# Patient Record
Sex: Female | Born: 1995 | Race: White | Hispanic: No | Marital: Single | Smoking: Never smoker
Health system: Southern US, Community
[De-identification: ages and names within clinical notes are randomized; demographics above are authoritative.]

## PROBLEM LIST (undated history)

## (undated) DIAGNOSIS — K5900 Constipation, unspecified: Secondary | ICD-10-CM

## (undated) DIAGNOSIS — R079 Chest pain, unspecified: Secondary | ICD-10-CM

## (undated) DIAGNOSIS — F840 Autistic disorder: Secondary | ICD-10-CM

## (undated) DIAGNOSIS — F845 Asperger's syndrome: Secondary | ICD-10-CM

## (undated) DIAGNOSIS — R55 Syncope and collapse: Secondary | ICD-10-CM

## (undated) DIAGNOSIS — F419 Anxiety disorder, unspecified: Secondary | ICD-10-CM

## (undated) DIAGNOSIS — T7840XA Allergy, unspecified, initial encounter: Secondary | ICD-10-CM

## (undated) HISTORY — DX: Allergy, unspecified, initial encounter: T78.40XA

## (undated) HISTORY — DX: Syncope and collapse: R55

## (undated) HISTORY — DX: Autistic disorder: F84.0

## (undated) HISTORY — PX: TYMPANOSTOMY TUBE PLACEMENT: SHX32

## (undated) HISTORY — DX: Asperger's syndrome: F84.5

## (undated) HISTORY — DX: Anxiety disorder, unspecified: F41.9

## (undated) HISTORY — DX: Chest pain, unspecified: R07.9

## (undated) HISTORY — DX: Constipation, unspecified: K59.00

---
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
## 2019-04-17 ENCOUNTER — Other Ambulatory Visit: Payer: Self-pay

## 2019-04-17 ENCOUNTER — Ambulatory Visit (INDEPENDENT_AMBULATORY_CARE_PROVIDER_SITE_OTHER): Payer: Medicaid Other | Admitting: Podiatry

## 2019-04-17 VITALS — Temp 97.9°F

## 2019-04-17 DIAGNOSIS — L603 Nail dystrophy: Secondary | ICD-10-CM

## 2019-04-17 NOTE — Progress Notes (Signed)
  Subjective:  Patient ID: Marissa Webster, female    DOB: 08/10/1995,  MRN: [id number] HPI Chief Complaint  Patient presents with  . Nail Problem    Patient presents with mother today for nail dystrophy bilat great toenails x 2-3 years.  She reports they are tender to touch and right nail is loose from nail bed    24 y.o. female presents with the above complaint.   ROS: Denies fever chills nausea vomiting muscle aches pains calf pain back pain chest pain shortness of breath.  Past Medical History:  Diagnosis Date  . Asperger syndrome   . Autism   . Chest pain, unspecified   . Syncope and collapse   . Unspecified constipation    No past surgical history on file.  Current Outpatient Medications:  .  cetirizine (ZYRTEC) 5 MG chewable tablet, Chew 5 mg by mouth daily., Disp: , Rfl:  .  famotidine (PEPCID) 20 MG tablet, Take by mouth., Disp: , Rfl:  .  Ascorbic Acid (VITAMIN C ER PO), Take 60 mLs by mouth daily., Disp: , Rfl:  .  B COMPLEX-C-BIOTIN-E-FA PO, Take 60 mLs by mouth daily., Disp: , Rfl:  .  DIGESTIVE ENZYMES PO, Take 60 mLs by mouth once a week., Disp: , Rfl:  .  OVER THE COUNTER MEDICATION, daily. 3 tabs daily... Therma Tabs, Disp: , Rfl:  .  OVER THE COUNTER MEDICATION, Antioxidant liquid, 1 cap full., Disp: , Rfl:   No Known Allergies Review of Systems Objective:   Vitals:   04/17/19 0951  Temp: 97.9 F (36.6 C)    General: Well developed, nourished, in no acute distress, alert and oriented x3   Dermatological: Skin is warm, dry and supple bilateral. Nails x 10 are well maintained; remaining integument appears unremarkable at this time. There are no open sores, no preulcerative lesions, no rash or signs of infection present.  Hallux nails bilaterally are thickened discolored and brittle.  Vascular: Dorsalis Pedis artery and Posterior Tibial artery pedal pulses are 2/4 bilateral with immedate capillary fill time. Pedal hair growth present. No varicosities and no  lower extremity edema present bilateral.   Neruologic: Grossly intact via light touch bilateral. Vibratory intact via tuning fork bilateral. Protective threshold with Semmes Wienstein monofilament intact to all pedal sites bilateral. Patellar and Achilles deep tendon reflexes 2+ bilateral. No Babinski or clonus noted bilateral.   Musculoskeletal: No gross boney pedal deformities bilateral. No pain, crepitus, or limitation noted with foot and ankle range of motion bilateral. Muscular strength 5/5 in all groups tested bilateral.  Gait: Unassisted, Nonantalgic.    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  We will discuss our options at that time as to whether treat any infection.  We also will discuss total matrixectomy's bilateral.     Kacin Dancy T. [place]ee[place], [place]

## 2019-04-17 NOTE — Patient Instructions (Signed)

## 2019-05-15 ENCOUNTER — Other Ambulatory Visit: Payer: Self-pay

## 2019-05-15 ENCOUNTER — Ambulatory Visit (INDEPENDENT_AMBULATORY_CARE_PROVIDER_SITE_OTHER): Payer: Medicare Other | Admitting: Podiatry

## 2019-05-15 DIAGNOSIS — L603 Nail dystrophy: Secondary | ICD-10-CM

## 2019-05-15 MED ORDER — FLUCONAZOLE 150 MG PO TABS: 300.0000 mg | ORAL_TABLET | ORAL | 0 refills | Status: DC

## 2019-05-15 NOTE — Progress Notes (Signed)
She presents today for follow-up of her nail samples. Samples were regarding the hallux nail bilaterally and the second digit.  Objective: No change in the nail plates. Pathology does result yeast infection.  Assessment: Yeast infection of the nail plate and nailbed.  Plan: She will take fluconazole 2 tablets once weekly for the next 11 months and in 3 months we will do liver profiles.

## 2019-08-21 ENCOUNTER — Other Ambulatory Visit: Payer: Self-pay

## 2019-08-21 ENCOUNTER — Ambulatory Visit (INDEPENDENT_AMBULATORY_CARE_PROVIDER_SITE_OTHER): Payer: PRIVATE HEALTH INSURANCE | Admitting: Podiatry

## 2019-08-21 DIAGNOSIS — L603 Nail dystrophy: Secondary | ICD-10-CM

## 2019-08-21 DIAGNOSIS — Z79899 Other long term (current) drug therapy: Secondary | ICD-10-CM

## 2019-08-21 MED ORDER — FLUCONAZOLE 150 MG PO TABS: 300.0000 mg | ORAL_TABLET | ORAL | 0 refills | Status: DC

## 2019-08-21 NOTE — Progress Notes (Signed)
She presents today with her mother for follow-up of her fluconazole hallux nail plate right.  She states that they can already tell a difference that it seems to be growing out better.  She would like for me to cut her nail hallux right.  Objective: Vital signs are stable alert and oriented x3 nail plate does appear to be growing out better from the proximal nail fold hallux right in particular.  Assessment: Nail dystrophy yeast.  Plan: Continue the use of the fluconazole and I trimmed the nail for her today.

## 2019-11-27 ENCOUNTER — Ambulatory Visit (INDEPENDENT_AMBULATORY_CARE_PROVIDER_SITE_OTHER): Payer: PRIVATE HEALTH INSURANCE | Admitting: Podiatry

## 2019-11-27 ENCOUNTER — Other Ambulatory Visit: Payer: Self-pay

## 2019-11-27 DIAGNOSIS — L603 Nail dystrophy: Secondary | ICD-10-CM

## 2019-11-27 MED ORDER — FLUCONAZOLE 150 MG PO TABS: 300.0000 mg | ORAL_TABLET | ORAL | 0 refills | Status: DC

## 2019-11-27 NOTE — Progress Notes (Signed)
She presents with her mother today for evaluation of her fluconazole and her foot fungus.  Her mother herself states that it seems to be growing out very nicely denies any problems taking the medication.  Objective: Vital signs stable alert and oriented x3 toenails are long thick yellow dystrophic-like mycotic but appear to be growing out.  Assessment: Slowly growing onychomycosis.  Plan: At this point I did refill her fluconazole and I will follow-up with her in about 3 months.  I also debrided her nails for her.

## 2020-03-04 ENCOUNTER — Encounter: Payer: Self-pay | Admitting: Podiatry

## 2020-03-04 ENCOUNTER — Other Ambulatory Visit: Payer: Self-pay

## 2020-03-04 ENCOUNTER — Ambulatory Visit (INDEPENDENT_AMBULATORY_CARE_PROVIDER_SITE_OTHER): Payer: PRIVATE HEALTH INSURANCE | Admitting: Podiatry

## 2020-03-04 DIAGNOSIS — L603 Nail dystrophy: Secondary | ICD-10-CM

## 2020-03-04 MED ORDER — FLUCONAZOLE 150 MG PO TABS: 300.0000 mg | ORAL_TABLET | ORAL | 0 refills | Status: DC

## 2020-03-04 NOTE — Progress Notes (Signed)
She presents today for follow-up of her fluconazole states that the toenails are looking better with exception of the hallux right.  She continues to take the medication without any problems.  Objective: Vital signs are stable she is alert and oriented x3 there is no erythema edema cellulitis drainage or odor.  Nails appear to be healing with the exception of the hallux right.  Assessment: Onychomycosis hallux right with nail dystrophy.  Plan: We will continue the use of her fluconazole and I will follow-up with her in 3 months.

## 2020-04-02 ENCOUNTER — Ambulatory Visit (INDEPENDENT_AMBULATORY_CARE_PROVIDER_SITE_OTHER): Payer: Medicare Other | Admitting: Podiatry

## 2020-04-02 ENCOUNTER — Other Ambulatory Visit: Payer: Self-pay

## 2020-04-02 ENCOUNTER — Encounter: Payer: Self-pay | Admitting: Podiatry

## 2020-04-02 DIAGNOSIS — L03031 Cellulitis of right toe: Secondary | ICD-10-CM | POA: Diagnosis not present

## 2020-04-02 DIAGNOSIS — L600 Ingrowing nail: Secondary | ICD-10-CM

## 2020-04-02 DIAGNOSIS — L603 Nail dystrophy: Secondary | ICD-10-CM | POA: Diagnosis not present

## 2020-04-02 NOTE — Progress Notes (Signed)
  Subjective:  Patient ID: Marissa Webster, female    DOB: 1995-04-28,  MRN: [id number]  Chief Complaint  Patient presents with  . Toe Pain    Right hallux has a pus pocket on the side of the toe     25 y.o. female presents with the above complaint. History confirmed with patient.  She returns with her mother for an urgent visit.  Dr. Al Corpus has been treating her for nail dystrophy and nail fungus for some time.  This is improved quite a bit.  Over the last couple days the lateral right corner of the hallux nail began to really digging the corners and cause pain and swelling.  Having quite a bit of pus last night.  Objective:  Physical Exam: warm, good capillary refill, no trophic changes or ulcerative lesions, normal DP and PT pulses and normal sensory exam.   Right Foot: Dystrophic hallux nail with onycholysis and distal lateral corner deeply ingrown into the skin causing paronychia  Assessment:  No diagnosis found.   Plan:  Patient was evaluated and treated and all questions answered.  Unfortunately hallux nail has become quite loose and is now digging in the corner causing a deep paronychia ingrown.  Discussed the patient and her mother it would be best for total nail avulsion and allow the new nail to continue to grow out while she is still on fluconazole.  There should not agreed to the procedure.    Ingrown Nail, right -Patient elects to proceed with minor surgery to remove ingrown toenail today. Consent reviewed and signed by patient and her mother. -Ingrown nail excised. See procedure note. -Educated on post-procedure care including soaking. Written instructions provided and reviewed. -Patient to follow up with Dr. Al Corpus  Procedure: Excision of Ingrown Toenail Location: Right 1st toe  nail . Anesthesia: Lidocaine 1% plain; 1.5 mL and Marcaine 0.5% plain; 1.5 mL, digital block. Skin Prep: Betadine. Dressing: Silvadene; telfa; dry, sterile, compression dressing. Technique:  Following skin prep, the toe was exsanguinated and a tourniquet was secured at the base of the toe. The affected nail was freed, and excised. . The tourniquet was then removed and sterile dressing applied. Disposition: Patient tolerated procedure well.     No follow-ups on file.

## 2020-04-02 NOTE — Patient Instructions (Signed)

## 2020-04-16 ENCOUNTER — Encounter: Payer: Self-pay | Admitting: Podiatry

## 2020-04-16 ENCOUNTER — Ambulatory Visit (INDEPENDENT_AMBULATORY_CARE_PROVIDER_SITE_OTHER): Payer: Medicare Other | Admitting: Podiatry

## 2020-04-16 ENCOUNTER — Other Ambulatory Visit: Payer: Self-pay

## 2020-04-16 DIAGNOSIS — Z9889 Other specified postprocedural states: Secondary | ICD-10-CM

## 2020-04-16 DIAGNOSIS — L03031 Cellulitis of right toe: Secondary | ICD-10-CM

## 2020-04-18 NOTE — Progress Notes (Signed)
Marissa Webster presents with her mother today for follow-up of her nail avulsion hallux right t.  She denies fever chills nausea vomit states that she is doing quite well.  She states it seems to be healing up.  Objective: Vital signs are stable alert oriented x3.  There is no erythema edema cellulitis drainage odor scabs present over the hallux nail bed.  Assessment: Well-healing surgical toe performed by Dr. Lilian Kapur.  Plan: Covered in the day leave open at night start soaking if it becomes tender.  Epson salts and warm water will suffice.  She will follow-up with me on an in the next few weeks for follow-up of her oral medication.

## 2020-06-03 ENCOUNTER — Ambulatory Visit: Payer: Medicare Other | Admitting: Podiatry

## 2020-06-17 ENCOUNTER — Encounter: Payer: Self-pay | Admitting: Podiatry

## 2020-06-17 ENCOUNTER — Ambulatory Visit (INDEPENDENT_AMBULATORY_CARE_PROVIDER_SITE_OTHER): Payer: PRIVATE HEALTH INSURANCE | Admitting: Podiatry

## 2020-06-17 ENCOUNTER — Other Ambulatory Visit: Payer: Self-pay

## 2020-06-17 DIAGNOSIS — L603 Nail dystrophy: Secondary | ICD-10-CM

## 2020-06-17 NOTE — Progress Notes (Signed)
She presents today with her mother stating that her nails look good she is happy with the way they are growing out and have improved over the past year.  She denies any problems taking the medication.  Objective: Vital signs are stable she is alert and oriented x3.  There is no erythema edema cellulitis drainage or odor.  Toenails have improved over the past year with the use of the fluconazole.  Assessment: Long-term therapy for onychomycosis.  Plan: At this point we are going to discontinue the use of the fluconazole until we see the need to start it again.  Should she have questions or concerns or thinks that she has any recurrent she will notify us immediately.

## 2020-09-02 ENCOUNTER — Ambulatory Visit: Payer: PRIVATE HEALTH INSURANCE | Admitting: Podiatry

## 2022-05-19 ENCOUNTER — Ambulatory Visit (INDEPENDENT_AMBULATORY_CARE_PROVIDER_SITE_OTHER): Payer: Medicare Other | Admitting: Podiatry

## 2022-05-19 DIAGNOSIS — L603 Nail dystrophy: Secondary | ICD-10-CM | POA: Diagnosis not present

## 2022-05-19 DIAGNOSIS — L600 Ingrowing nail: Secondary | ICD-10-CM | POA: Diagnosis not present

## 2022-05-19 NOTE — Progress Notes (Signed)
She presents today chief concern of a painful hallux nail bilaterally.  These are dystrophic nails are starting to grow into the skin.  Objective: Vital stable oriented x 3 there is no erythema Dem salines drainage odor nails are thick yellow dystrophic possibly mycotic incurvated debrided them today no pain.  Assessment: Well-healing nail dystrophy onychomycosis.  Plan: Follow-up with her as needed

## 2022-05-30 ENCOUNTER — Encounter: Payer: Self-pay | Admitting: Family Medicine

## 2022-05-30 ENCOUNTER — Ambulatory Visit: Payer: PRIVATE HEALTH INSURANCE | Admitting: Family Medicine

## 2022-05-30 VITALS — BP 100/60 | HR 62 | Temp 98.3°F | Resp 16 | Ht 64.0 in | Wt 172.4 lb

## 2022-05-30 DIAGNOSIS — N926 Irregular menstruation, unspecified: Secondary | ICD-10-CM

## 2022-05-30 DIAGNOSIS — F840 Autistic disorder: Secondary | ICD-10-CM | POA: Diagnosis not present

## 2022-05-30 NOTE — Progress Notes (Signed)
New Patient Office Visit  Subjective:  Patient ID: Marissa Webster, female    DOB: 19-Sep-1995  Age: 27 y.o. MRN: [id number]  CC:  Chief Complaint  Patient presents with   Establish Care    Initial visit to establish care with new pcp, need MD     HPI Marissa Webster presents for establish.  New patient-here w/Mom-Marissa Webster  Marissa Webster-lives at home w/mom and step dad and bro.  2 cats and dog. Independent.  Will learn to drive soon.  Scared of stove.  Anxiety-lost co-worker due to suicide.  Processing ok.  Irreg menses-about every 3 month(s)-seeing Dr. Vincente Poli    Current Outpatient Medications:    cetirizine (ZYRTEC) 5 MG chewable tablet, Chew 5 mg by mouth daily., Disp: , Rfl:    OVER THE COUNTER MEDICATION, daily. Isotonix OPC, Disp: , Rfl:    OVER THE COUNTER MEDICATION, Nature's Bounty: BIOTIN, A, C, E AND SODIUM, Disp: , Rfl:    OVER THE COUNTER MEDICATION, Isotonix Activated B Complex, Disp: , Rfl:   Past Medical History:  Diagnosis Date   Allergy    Anxiety    Asperger syndrome    Marissa Webster    Chest pain, unspecified    Syncope and collapse    Unspecified constipation     Past Surgical History:  Procedure Laterality Date   TYMPANOSTOMY TUBE PLACEMENT      Family History  Problem Relation Age of Onset   Miscarriages / [place] Mother    Skin cancer Mother    Skin cancer Maternal Uncle    Miscarriages / Stillbirths Maternal Grandmother    Hypertension Maternal Grandmother    Arthritis Maternal Grandmother    Skin cancer Maternal Grandmother    Cancer Maternal Grandfather 107       lung,colon,spine   Arthritis Maternal Grandfather     Social History   Socioeconomic History   Marital status: Single    Spouse name: Not on file   Number of children: Not on file   Years of education: Not on file   Highest education level: Not on file  Occupational History   Occupation: [employer]  Tobacco Use   Smoking status: Never   Smokeless tobacco: Never  Vaping Use    Vaping Use: Never used  Substance and Sexual Activity   Alcohol use: Never   Drug use: Never   Sexual activity: Never  Other Topics Concern   Not on file  Social History Narrative   Not on file   Social Determinants of Health   Financial Resource Strain: Not on file  Food Insecurity: Not on file  Transportation Needs: Not on file  Physical Activity: Not on file  Stress: Not on file  Social Connections: Not on file  Intimate Partner Violence: Not on file    ROS  ROS: Gen: no fever, chills  Skin: no rash, itching ENT: no ear pain, ear drainage, nasal congestion, rhinorrhea, sinus pressure, sore throat.  Some allergies. Spring/Fall Eyes: no blurry vision, double vision Resp: no cough, wheeze,SOB CV: no CP, palpitations, LE edema,  GI: no heartburn, n/v/d, abd pain.  Chronic constipation-taking OTC (available over the counter without a prescription).  Does have a "section" of colon that's issue. Has had workup.  GU: no dysuria, urgency, frequency, hematuria MSK: no joint pain, myalgias, back pain Neuro: no dizziness,, weakness, vertigo.   Headaches-FH migraine.  Week(s) of cycle, headache(s). About 3-4/year  no aura.   Psych: no depression, anxiety, insomnia, SI  Occasional syncope in supper from electrolytes.  Objective:   Today's Vitals: BP 100/60   Pulse 62   Temp 98.3 F (36.8 C) (Temporal)   Resp 16   Ht 5\' 4"  (1.626 m)   Wt 172 lb 6 oz (78.2 kg)   SpO2 98%   BMI 29.59 kg/m   Physical Exam  Gen: WDWN NAD WF HEENT: NCAT, conjunctiva not injected, sclera nonicteric TM WNL B, OP moist, no exudates  NECK:  supple, no thyromegaly, no nodes, no carotid bruits CARDIAC: RRR, S1S2+, no murmur. DP 2+B LUNGS: CTAB. No wheezes ABDOMEN:  BS+, soft, NTND, No HSM, no masses EXT:  no edema MSK: no gross abnormalities.  NEURO: A&O x3.  CN II-XII intact.  PSYCH: normal mood. Good eye contact   Assessment & Plan:  Marissa Webster Assessment & Plan: Chronic.  Stable.  Patient  is fairly independent.  Here today to establish care.   Irregular menses  Irreg menses-patient no SA.  Seeing gynecology.  No intervention at this time.   Follow up annual exam in 6 month(s).  Follow-up: Return in about 6 months (around 11/30/2022) for annual physical.   Marissa Sole, MD

## 2022-05-30 NOTE — Assessment & Plan Note (Signed)
Chronic.  Stable.  Patient is fairly independent.  Here today to establish care.

## 2022-05-30 NOTE — Patient Instructions (Signed)
Welcome to [facility]! It was a pleasure meeting you today.  As discussed, Please schedule a 6 month follow up visit today.  Do massage of tummy.  K-Y jelly for having BM.   PLEASE NOTE:  If you had any LAB tests please let us know if you have not heard back within a few days. You may see your results on MyChart before we have a chance to review them but we will give you a call once they are reviewed by [place]. If we ordered any REFERRALS today, please let us know if you have not heard from their office within the next week.  Let us know through MyChart if you are needing REFILLS, or have your pharmacy send [place] the request. You can also use MyChart to communicate with me or any office staff.  Please try these tips to maintain a healthy lifestyle:  Eat most of your calories during the day when you are active. Eliminate processed foods including packaged sweets (pies, cakes, cookies), reduce intake of potatoes, white bread, white pasta, and white rice. Look for whole grain options, oat flour or almond flour.  Each meal should contain half fruits/vegetables, one quarter protein, and one quarter carbs (no bigger than a computer mouse).  Cut down on sweet beverages. This includes juice, soda, and sweet tea. Also watch fruit intake, though this is a healthier sweet option, it still contains natural sugar! Limit to 3 servings daily.  Drink at least 1 glass of water with each meal and aim for at least 8 glasses per day  Exercise at least 150 minutes every week.

## 2022-06-20 ENCOUNTER — Ambulatory Visit: Payer: Medicare Other

## 2022-11-30 ENCOUNTER — Encounter: Payer: Medicare Other | Admitting: Family Medicine

## 2023-02-20 ENCOUNTER — Ambulatory Visit: Payer: Medicare (Managed Care) | Admitting: Podiatry

## 2023-02-21 ENCOUNTER — Ambulatory Visit (INDEPENDENT_AMBULATORY_CARE_PROVIDER_SITE_OTHER): Payer: Medicare (Managed Care) | Admitting: Podiatry

## 2023-02-21 ENCOUNTER — Encounter: Payer: Self-pay | Admitting: Podiatry

## 2023-02-21 DIAGNOSIS — L603 Nail dystrophy: Secondary | ICD-10-CM

## 2023-02-21 DIAGNOSIS — L600 Ingrowing nail: Secondary | ICD-10-CM | POA: Diagnosis not present

## 2023-02-22 ENCOUNTER — Telehealth: Payer: Self-pay | Admitting: Urology

## 2023-02-22 NOTE — Progress Notes (Signed)
She presents with her mother today for chief concern of painful hallux nails bilaterally they have decided they just want to have them completely removed.  The special needs young lady is prepared to have them removed but is not excited about the needle that she may have to have today.  Objective: Vital signs are stable alert oriented x 3 pulses palpable.  Hallux nails are severely dystrophic with tried multiple times with medications with no improvement.  Assessment chronic painful hallux nails bilateral.  Plan: We are going to have these done at the surgery center so that she can have some light anesthesia and then numbing of her toes.  We consented her today for permanent matrixectomy surgical.  Discussed with her and her mother the possible postop complications and side effects associated with this they understand and are amenable to it and will follow-up with [place] in a few weeks.

## 2023-02-22 NOTE — Telephone Encounter (Signed)
LM for mom to call back when she is ready to schedule pts sx with Dr. Al Corpus.

## 2023-02-27 ENCOUNTER — Telehealth: Payer: Self-pay | Admitting: Podiatry

## 2023-02-27 NOTE — Telephone Encounter (Signed)
Called and left message in regards to making sure that we have the correct insurance info for pts upcoming surgery so that auth can get started.

## 2023-03-13 ENCOUNTER — Telehealth: Payer: Self-pay | Admitting: Podiatry

## 2023-03-13 NOTE — Telephone Encounter (Signed)
 DOS - 03/17/23  EXC NAIL PERM 1ST BILAT - [id number]  [employer] MEDICARE EFFECTIVE DATE- 01/11/23  PER AN INCOMING FAX FROM [facility] MEDICARE, PRIOR AUTH HAS BEEN APPROVED FOR CPT CODE 47425 (2 UNITS). GOOD FROM 03/17/23 - 05/16/23.  AUTH #: [id number]

## 2023-03-17 DIAGNOSIS — L600 Ingrowing nail: Secondary | ICD-10-CM | POA: Diagnosis not present

## 2023-03-30 ENCOUNTER — Encounter: Payer: Self-pay | Admitting: Podiatry

## 2023-03-30 ENCOUNTER — Ambulatory Visit (INDEPENDENT_AMBULATORY_CARE_PROVIDER_SITE_OTHER): Payer: Medicare (Managed Care) | Admitting: Podiatry

## 2023-03-30 DIAGNOSIS — L600 Ingrowing nail: Secondary | ICD-10-CM

## 2023-03-30 DIAGNOSIS — Z9889 Other specified postprocedural states: Secondary | ICD-10-CM

## 2023-03-30 DIAGNOSIS — L603 Nail dystrophy: Secondary | ICD-10-CM

## 2023-03-30 NOTE — Progress Notes (Signed)
 She presents today for her first postop visit date of surgery 03/17/2023 surgical excision toenails hallux bilateral.  States that they are sore but all in all they are doing okay.  Objective: Vital signs are stable alert oriented x 3 dry sterile dressing removed demonstrates sutures are intact granulation tissue is healing in.  Assessment: Well-healing surgical toes.  Plan: Cover during the day with ointment and dressed aggressive dressing at night after soaking with Betadine and warm water I encouraged her to allow the toes to air dry and tent with a Band-Aid.  Follow-up with her in 1 to 2 weeks.

## 2023-04-06 ENCOUNTER — Encounter: Payer: Self-pay | Admitting: Podiatry

## 2023-04-06 ENCOUNTER — Ambulatory Visit (INDEPENDENT_AMBULATORY_CARE_PROVIDER_SITE_OTHER): Payer: Medicare (Managed Care) | Admitting: Podiatry

## 2023-04-06 DIAGNOSIS — Z9889 Other specified postprocedural states: Secondary | ICD-10-CM

## 2023-04-06 DIAGNOSIS — L600 Ingrowing nail: Secondary | ICD-10-CM

## 2023-04-06 NOTE — Progress Notes (Signed)
 She presents today for second postop visit status post surgical excision toenails hallux bilateral with sutures.  She states that they are still little tender but they are getting better each day.  Continues to have daily.  Objective: Vital signs are stable alert oriented x 3.  Toes are healing well sutures are still intact much less drainage.  Assessment: Well-healing surgical toes.  Plan: Continue current therapies and I will follow-up with her in 2 weeks.

## 2023-04-20 ENCOUNTER — Encounter: Payer: Self-pay | Admitting: Podiatry

## 2023-04-20 ENCOUNTER — Ambulatory Visit (INDEPENDENT_AMBULATORY_CARE_PROVIDER_SITE_OTHER): Payer: Medicare (Managed Care) | Admitting: Podiatry

## 2023-04-20 DIAGNOSIS — Z9889 Other specified postprocedural states: Secondary | ICD-10-CM

## 2023-04-20 DIAGNOSIS — L600 Ingrowing nail: Secondary | ICD-10-CM | POA: Diagnosis not present

## 2023-04-20 MED ORDER — CLINDAMYCIN HCL 150 MG PO CAPS: 150.0000 mg | ORAL_CAPSULE | Freq: Three times a day (TID) | ORAL | 0 refills | Status: DC

## 2023-04-20 NOTE — Progress Notes (Signed)
 She presents today for follow-up of her surgical excision toenails hallux bilateral.  She denies fever chills nausea ramoplanin well they have healed up enough that they can wear her crocs now.  She states that there was some drainage from the left groin and has been more tender than the right 1.  Objective: Vital signs are stable alert oriented x 3.  There is no erythema edema cellulitis drainage or odor to the hallux right.  The hallux left does demonstrate some erythema to the level of the interphalangeal joint.  There is some dry serosanguineous drainage nailbed left.  Absorbable Monocryl sutures are still intact bilaterally.  Assessment: Well-healing surgical toe hallux right well-healing surgical toe hallux left foot mild paronychia or cellulitic process.  Plan: Encouraged continued soaking and started her on clindamycin.  I will follow-up with her in 2 weeks.  Call sooner if needed

## 2023-05-04 ENCOUNTER — Encounter: Payer: Self-pay | Admitting: Podiatry

## 2023-05-04 ENCOUNTER — Ambulatory Visit (INDEPENDENT_AMBULATORY_CARE_PROVIDER_SITE_OTHER): Payer: Medicare (Managed Care) | Admitting: Podiatry

## 2023-05-04 DIAGNOSIS — L600 Ingrowing nail: Secondary | ICD-10-CM | POA: Diagnosis not present

## 2023-05-04 DIAGNOSIS — Z9889 Other specified postprocedural states: Secondary | ICD-10-CM

## 2023-05-04 NOTE — Progress Notes (Signed)
 She presents with her mother today she is status post surgical excision hallux nails bilaterally date of surgery was 03/17/2023.  States that they seem to be doing a little bit better.  Mother states that they have been soaking but they tend to get really soggy when they soak so they have been discontinuing them for some time.  She has been covering with Neosporin and a Band-Aid.  Objective: Vital signs stable oriented x 3 dissolvable sutures are still intact.  There is no erythema cellulitis drainage or odor appears to be healing well.  They are drying out a little much improved from previous evaluation.  Assessment: Well-healed surgical toes retained sutures.  Plan: Redress them with Band-Aids today follow-up with her in 2 to 3 weeks I did encourage soaking and washing them in hopes that these sutures will come out.  I did recommend the use of Neosporin pain for her next visit so that we may be able to remove the sutures that have not fallen out on their own.

## 2023-05-18 ENCOUNTER — Ambulatory Visit: Payer: Medicare (Managed Care) | Admitting: Podiatry

## 2023-05-18 DIAGNOSIS — L600 Ingrowing nail: Secondary | ICD-10-CM

## 2023-05-18 DIAGNOSIS — Z9889 Other specified postprocedural states: Secondary | ICD-10-CM

## 2023-05-18 NOTE — Progress Notes (Signed)
 She presents today for follow-up of her surgical matrixectomy's.  She states that all the sutures are out she is feeling much better she is very happy with the outcome thus far.  She states that she has been keeping them moisturized so that the scab still hurts when she bends her toes.  Objective: Vital signs are stable alert oriented x 3 there is no erythema edema cellulitis drainage or odor there is some scaly tissue overlying the nailbed and the tibial margin of the hallux left in particular.  Does not appear to be nail at this point.  Assessment: Well-healing surgical toes.  Plan: Allow her to continue moisturizing the toes on a daily basis and I will follow-up with her in 6 weeks.

## 2023-06-16 ENCOUNTER — Telehealth: Payer: Self-pay | Admitting: Podiatry

## 2023-06-16 NOTE — Telephone Encounter (Signed)
 The dates of service on 4/10 and 4/24 were filed as regular visits rather than as continued post-op visits. Could these please be re-filed correctly?  You may call the patient's mother to provide an update and leave a message if she does not answer, as she may be working. She is somewhat frustrated, as I am the fourth person attempting to resolve this issue this week.  Thank you for your assistance.

## 2023-06-29 ENCOUNTER — Ambulatory Visit (INDEPENDENT_AMBULATORY_CARE_PROVIDER_SITE_OTHER): Payer: Medicare (Managed Care) | Admitting: Podiatry

## 2023-06-29 ENCOUNTER — Encounter: Payer: Self-pay | Admitting: Podiatry

## 2023-06-29 DIAGNOSIS — Z9889 Other specified postprocedural states: Secondary | ICD-10-CM

## 2023-06-29 NOTE — Progress Notes (Signed)
 She presents today with her mother date of surgery for her toenails as 03/17/2023.  Surgical excision toenails and matrix with big toes.  She denies fever nausea vomiting muscle aches or pains states that she has a little something growing here  Objective: Vital signs are stable oriented x 3 she has a small amount of regrowth on the left toe more so on the right.  Assessment: Well-healing surgical toe.  Bilateral great toes.  Plan: Follow-up with me with significant regrowth may need to consider reexcision.
